# Patient Record
Sex: Male | Born: 1975 | Race: White | Marital: Married
Health system: Southern US, Community
[De-identification: ages and names within clinical notes are randomized; demographics above are authoritative.]

---

## 2010-07-26 DEATH — deceased

## 2017-11-09 ENCOUNTER — Other Ambulatory Visit: Payer: Self-pay | Admitting: Orthopaedic Surgery

## 2017-11-09 DIAGNOSIS — M4716 Other spondylosis with myelopathy, lumbar region: Secondary | ICD-10-CM

## 2017-11-19 ENCOUNTER — Ambulatory Visit
Admission: RE | Admit: 2017-11-19 | Discharge: 2017-11-19 | Disposition: A | Discharge status: Home / Self Care | Payer: 59 | Source: Ambulatory Visit | Attending: Orthopaedic Surgery | Admitting: Orthopaedic Surgery

## 2017-11-19 DIAGNOSIS — M4716 Other spondylosis with myelopathy, lumbar region: Secondary | ICD-10-CM

## 2019-08-07 IMAGING — MR MR LUMBAR SPINE W/O CM
4 of 5 series · 26 of 48 positions shown · non-contrast
Comparison: None.

CLINICAL DATA: Chronic low back pain.  Numbness in both feet.

EXAM:
MRI LUMBAR SPINE WITHOUT CONTRAST
TECHNIQUE: Multiplanar, multisequence MR imaging of the lumbar spine was
performed. No intravenous contrast was administered.

[Series 4: T1 · sagittal · 4.0mm · 0.59mm/px · 5 of 13 slices shown (1 of 2)]
[im 1/13]
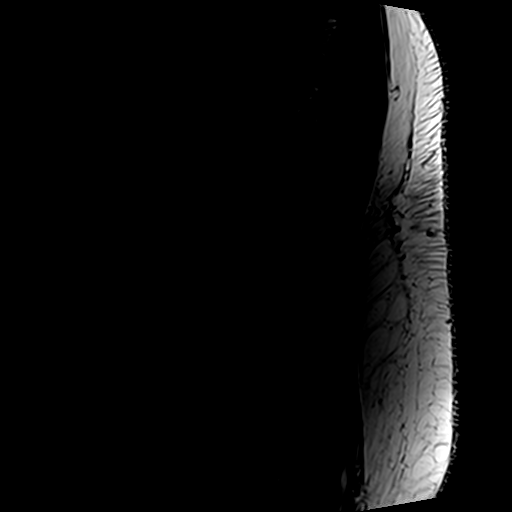
[im 4/13]
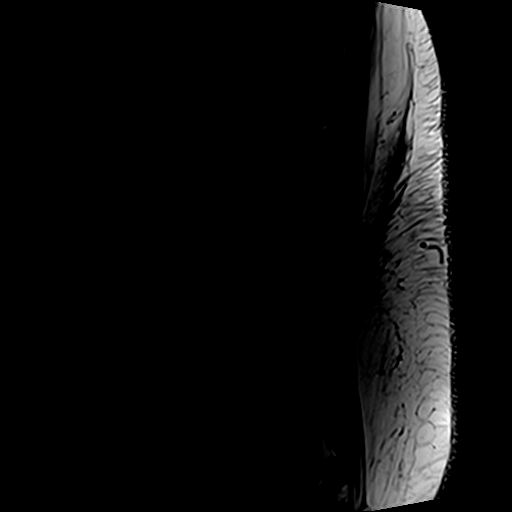
[im 7/13]
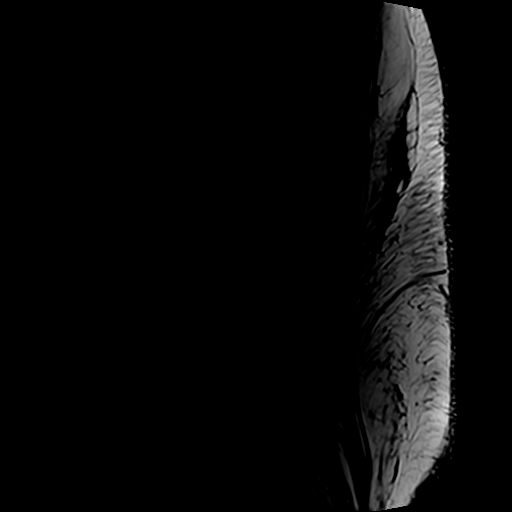
[im 10/13]
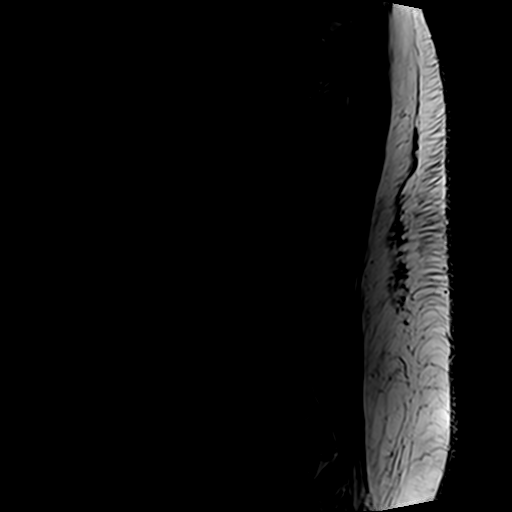
[im 13/13]
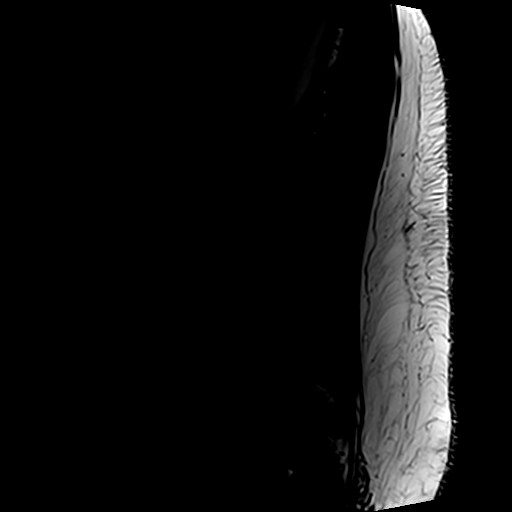

[Series 5: T2 post-contrast · sagittal · 4.0mm · 0.59mm/px · 5 of 13 slices shown]
[im 1/13]
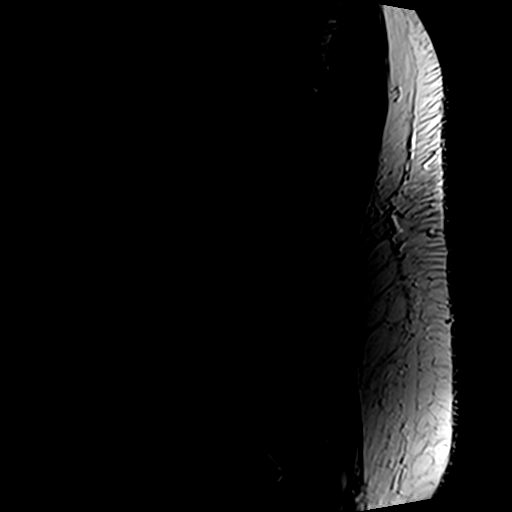
[im 4/13]
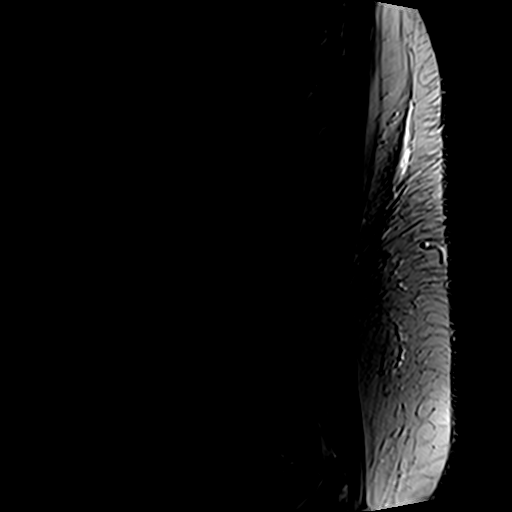
[im 7/13]
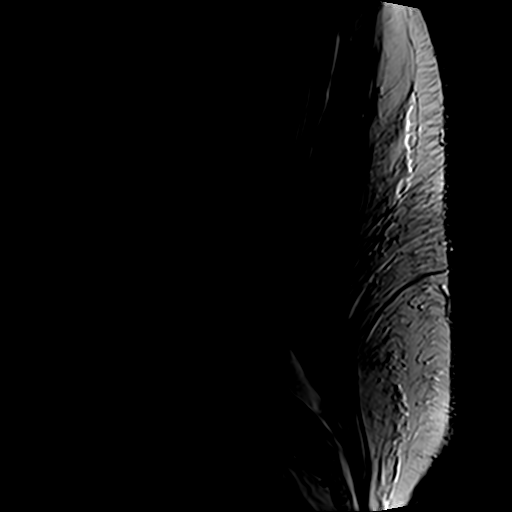
[im 10/13]
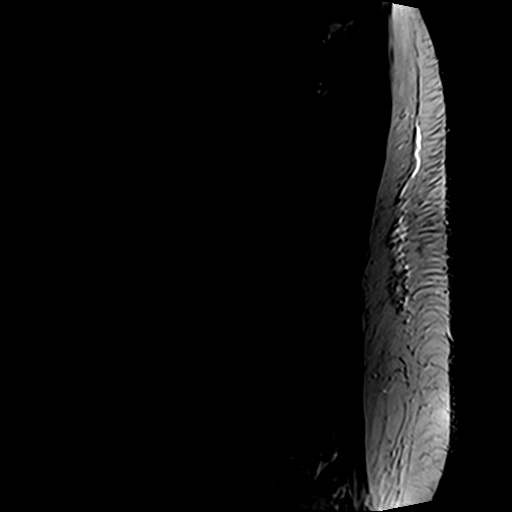
[im 13/13]
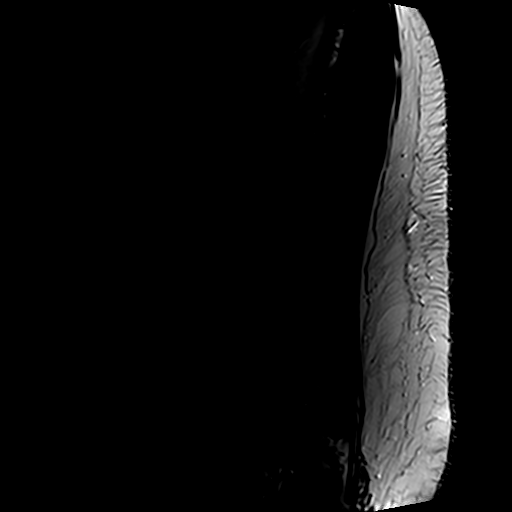

[Series 6: T2 · axial · 4.0mm · 0.78mm/px · z∈[-84,+125]mm · 10 of 41 slices shown]
[im 3/41]
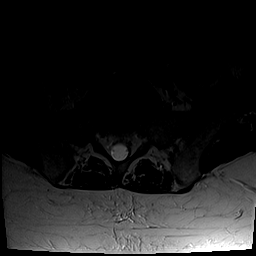
[im 6/41]
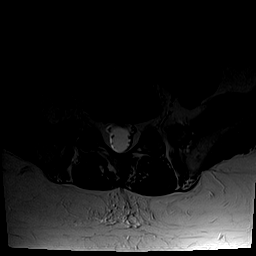
[im 9/41]
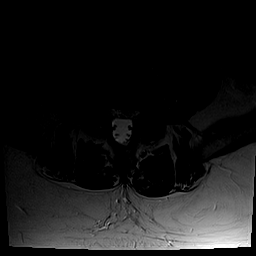
[im 14/41]
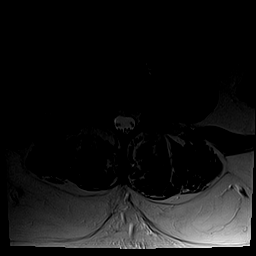
[im 19/41]
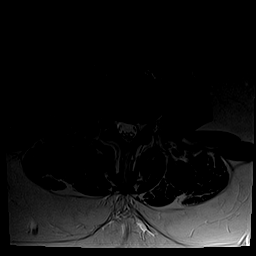
[im 22/41]
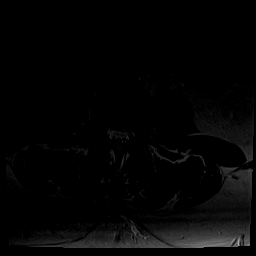
[im 25/41]
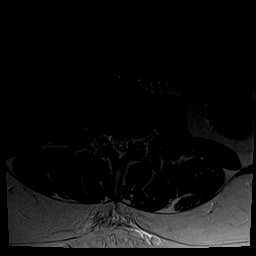
[im 30/41]
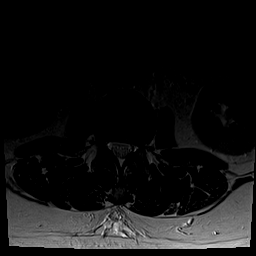
[im 35/41]
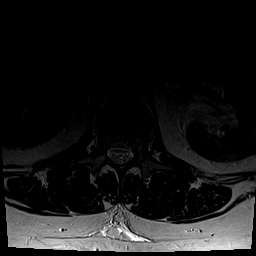
[im 41/41]
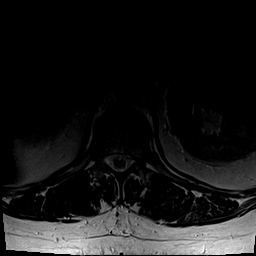

[Series 7: T1 · axial · 4.0mm · 0.39mm/px · z∈[-84,+95]mm · 6 of 41 slices shown (2 of 2)]
[im 3/41]
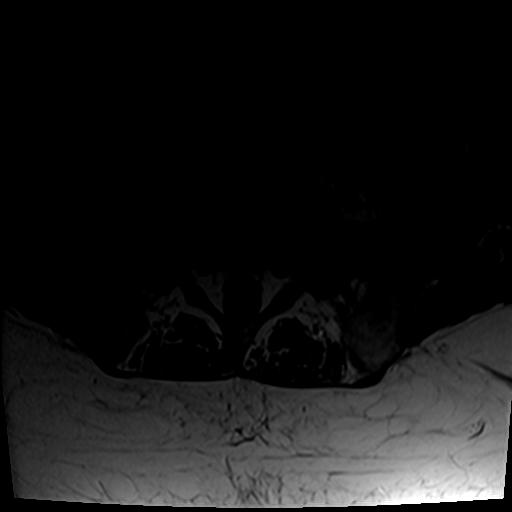
[im 6/41]
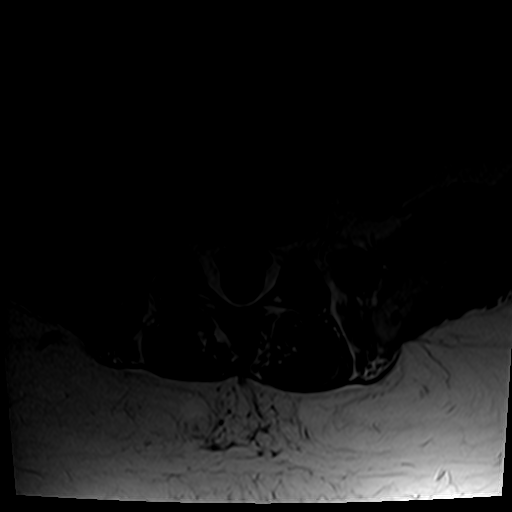
[im 9/41]
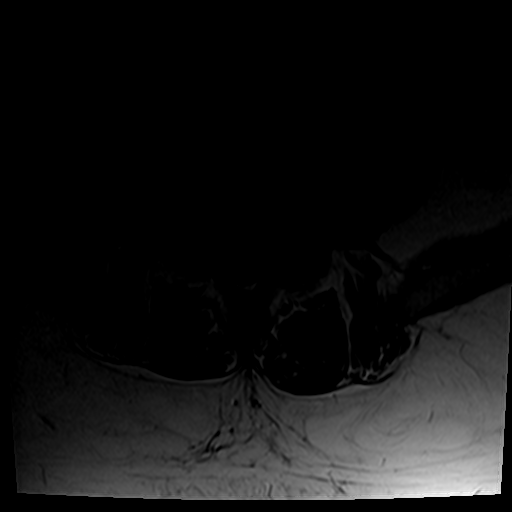
[im 14/41]
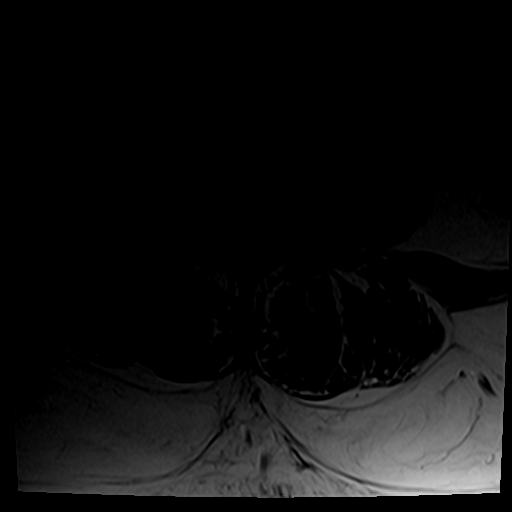
[im 22/41]
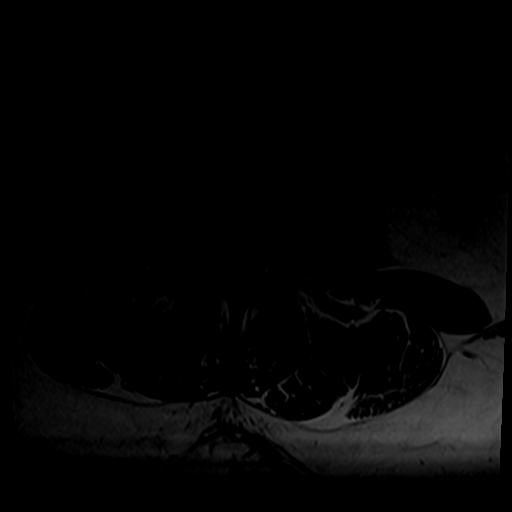
[im 35/41]
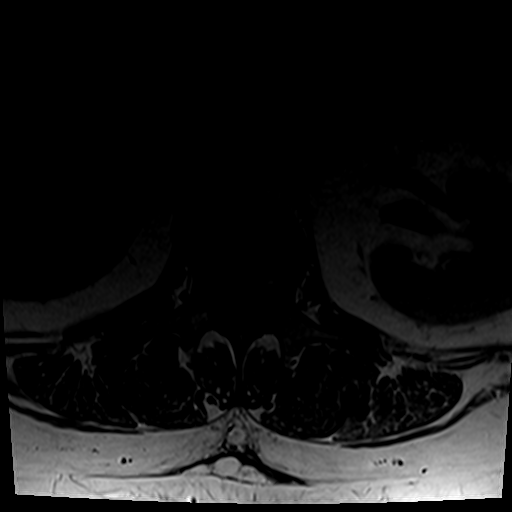

[26 of 48 positions shown; findings below may reference images not displayed]

FINDINGS: Segmentation:  5 lumbar type vertebral bodies.

Alignment:  Anterolisthesis at L5-S1 of 4-5 mm.

Vertebrae:  Chronic bilateral pars defects at L5.

Conus medullaris and cauda equina: Conus extends to the L1 level.
Conus and cauda equina appear normal.

Paraspinal and other soft tissues: Negative

Disc levels:

Disc degeneration at T11-12 with a disc bulge but no neural
compression. This level was not studied in the axial plane.

No abnormality from T12-L1 through L3-4.

L4-5: Mild bulging of the disc.  No canal or foraminal stenosis.

L5-S1: Chronic bilateral pars defects at L5 with 3-4 mm of
anterolisthesis. L5-S1 disc bulge. No compressive canal or foraminal
stenosis.
IMPRESSION: L5-S1: Chronic bilateral pars defects with 3-4 mm of
anterolisthesis. Bulging of the disc. No stenosis. Patient does not
show pronounced edema in the region of the pars defects.

L4-5: Disc bulge.  No stenosis or neural compression.

## 2021-05-06 ENCOUNTER — Other Ambulatory Visit: Payer: Self-pay | Admitting: Orthopaedic Surgery

## 2021-05-06 DIAGNOSIS — M4802 Spinal stenosis, cervical region: Secondary | ICD-10-CM

## 2021-05-09 ENCOUNTER — Other Ambulatory Visit: Payer: Self-pay | Admitting: Orthopaedic Surgery

## 2021-05-09 DIAGNOSIS — M4802 Spinal stenosis, cervical region: Secondary | ICD-10-CM

## 2021-05-17 ENCOUNTER — Other Ambulatory Visit: Payer: 59

## 2021-05-19 ENCOUNTER — Ambulatory Visit
Admission: RE | Admit: 2021-05-19 | Discharge: 2021-05-19 | Disposition: A | Discharge status: Home / Self Care | Payer: Self-pay | Source: Ambulatory Visit | Attending: Orthopaedic Surgery | Admitting: Orthopaedic Surgery

## 2021-05-19 ENCOUNTER — Ambulatory Visit
Admission: RE | Admit: 2021-05-19 | Discharge: 2021-05-19 | Disposition: A | Discharge status: Home / Self Care | Payer: No Typology Code available for payment source | Source: Ambulatory Visit | Attending: Orthopaedic Surgery | Admitting: Orthopaedic Surgery

## 2021-05-19 ENCOUNTER — Other Ambulatory Visit: Payer: Self-pay

## 2021-05-19 DIAGNOSIS — M4802 Spinal stenosis, cervical region: Secondary | ICD-10-CM

## 2021-05-19 MED ORDER — ONDANSETRON HCL 4 MG/2ML IJ SOLN
4.0000 mg | Freq: Once | INTRAMUSCULAR | Status: AC | PRN
  Administered 2021-05-19: 4 mg via INTRAMUSCULAR

## 2021-05-19 MED ORDER — MEPERIDINE HCL 50 MG/ML IJ SOLN
50.0000 mg | Freq: Once | INTRAMUSCULAR | Status: AC | PRN
  Administered 2021-05-19: 50 mg via INTRAMUSCULAR

## 2021-05-19 MED ORDER — IOPAMIDOL (ISOVUE-M 300) INJECTION 61%
10.0000 mL | Freq: Once | INTRAMUSCULAR | Status: AC
  Administered 2021-05-19: 10 mL via INTRATHECAL

## 2021-05-19 MED ORDER — DIAZEPAM 5 MG PO TABS
10.0000 mg | ORAL_TABLET | Freq: Once | ORAL | Status: AC
  Administered 2021-05-19: 10 mg via ORAL

## 2021-05-19 NOTE — Discharge Instructions (Signed)

## 2021-05-19 NOTE — Discharge Instr - Other Orders (Addendum)
1054: pt reports pain 8/10 from myelogram procedure. See mar.  1108: pt reports pain is starting to improve, ranking pain 4/10.
